# Patient Record
Sex: Female | Born: 1965 | Race: Black or African American | Hispanic: No | Marital: Single | Smoking: Never smoker
Health system: Southern US, Community
[De-identification: ages and names within clinical notes are randomized; demographics above are authoritative.]

---

## 2010-08-20 ENCOUNTER — Encounter: Admission: RE | Admit: 2010-08-20 | Discharge: 2010-08-20 | Payer: Self-pay | Admitting: Geriatric Medicine

## 2010-10-09 ENCOUNTER — Emergency Department (HOSPITAL_COMMUNITY)
Admission: EM | Admit: 2010-10-09 | Discharge: 2010-10-09 | Payer: Self-pay | Source: Home / Self Care | Admitting: Internal Medicine

## 2010-10-14 ENCOUNTER — Emergency Department (HOSPITAL_COMMUNITY)
Admission: EM | Admit: 2010-10-14 | Discharge: 2010-10-14 | Payer: Self-pay | Source: Home / Self Care | Admitting: Emergency Medicine

## 2012-04-27 IMAGING — CR DG CHEST 2V
2 series · 2 of 2 positions shown · non-contrast
Comparison: None.

CLINICAL DATA: Headache.  Cough.  Shortness of breath.

CHEST - 2 VIEW

[w chest pa]
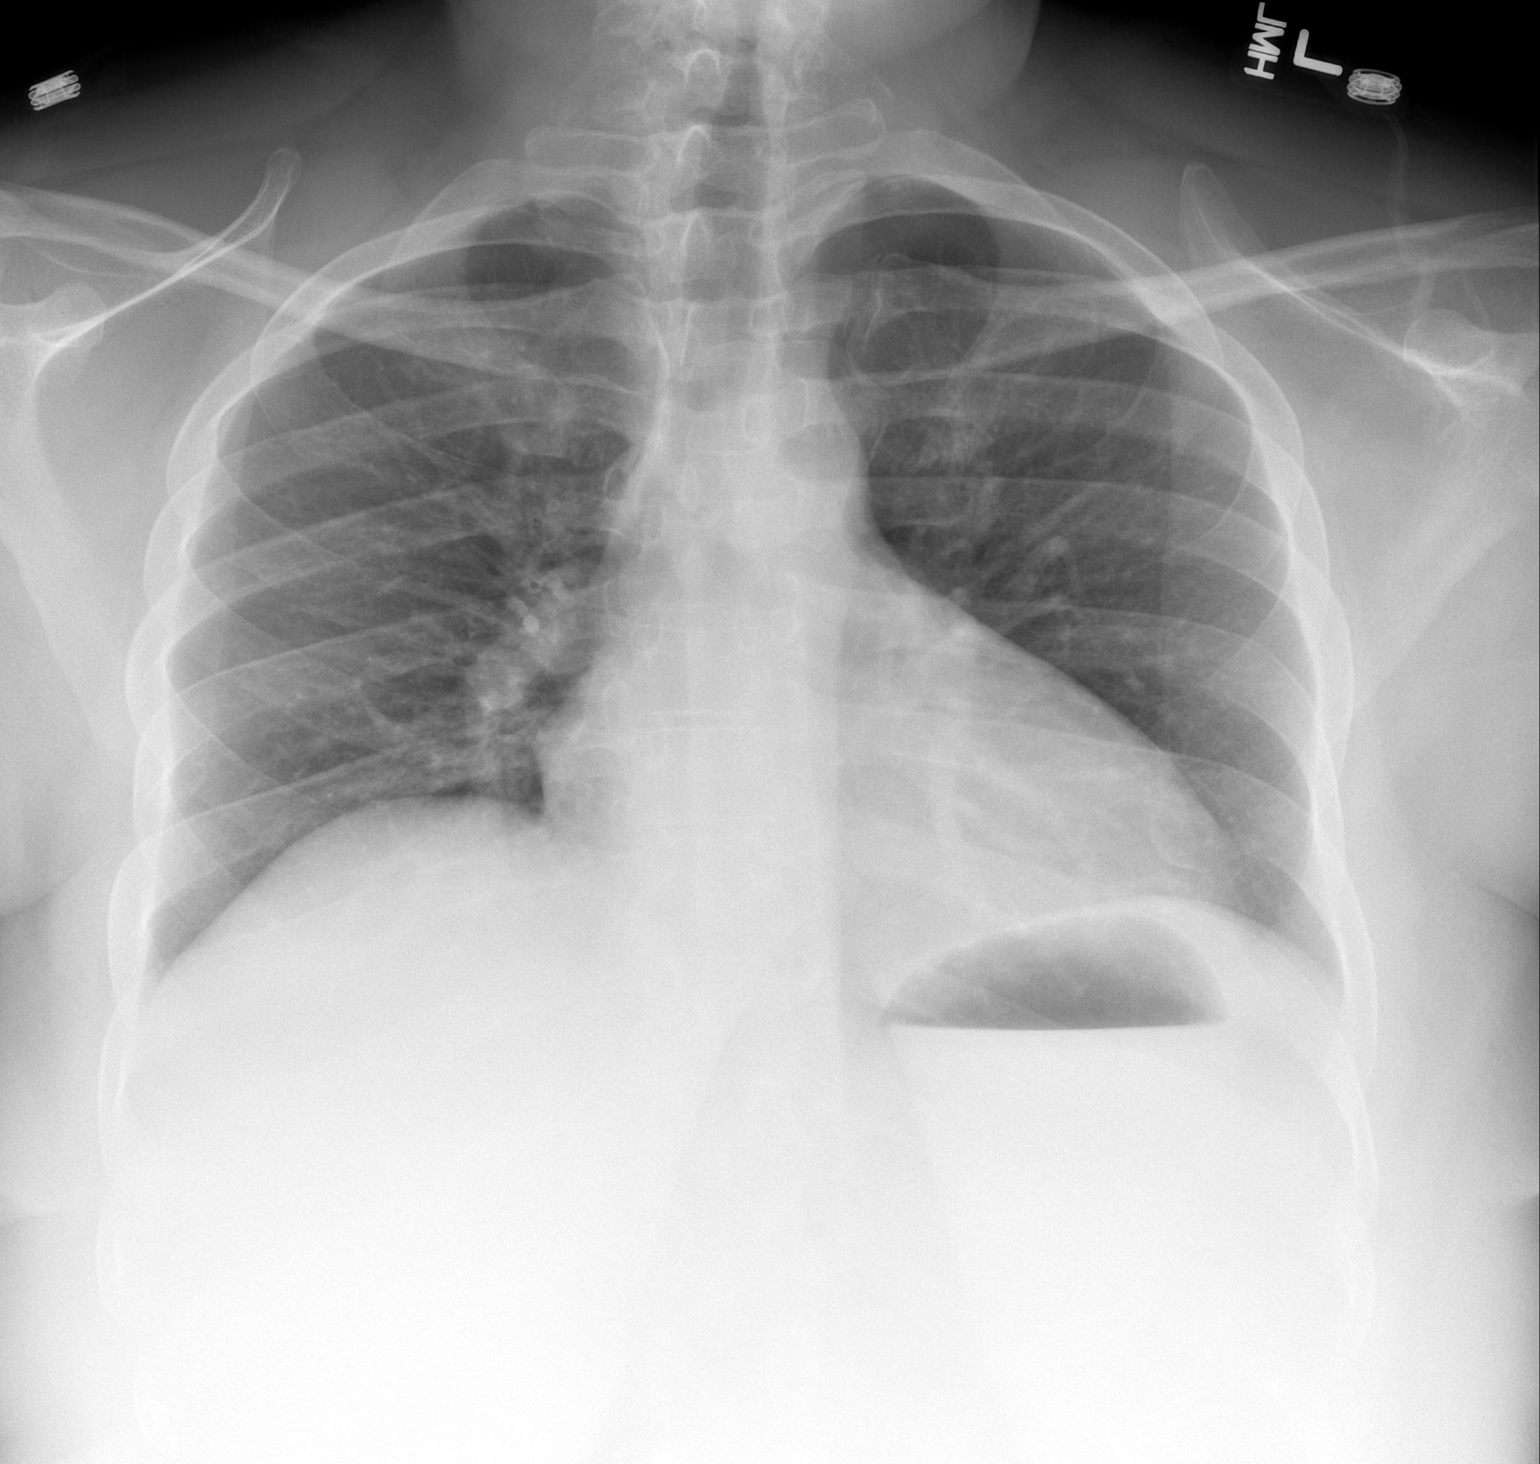

[w chest lat]
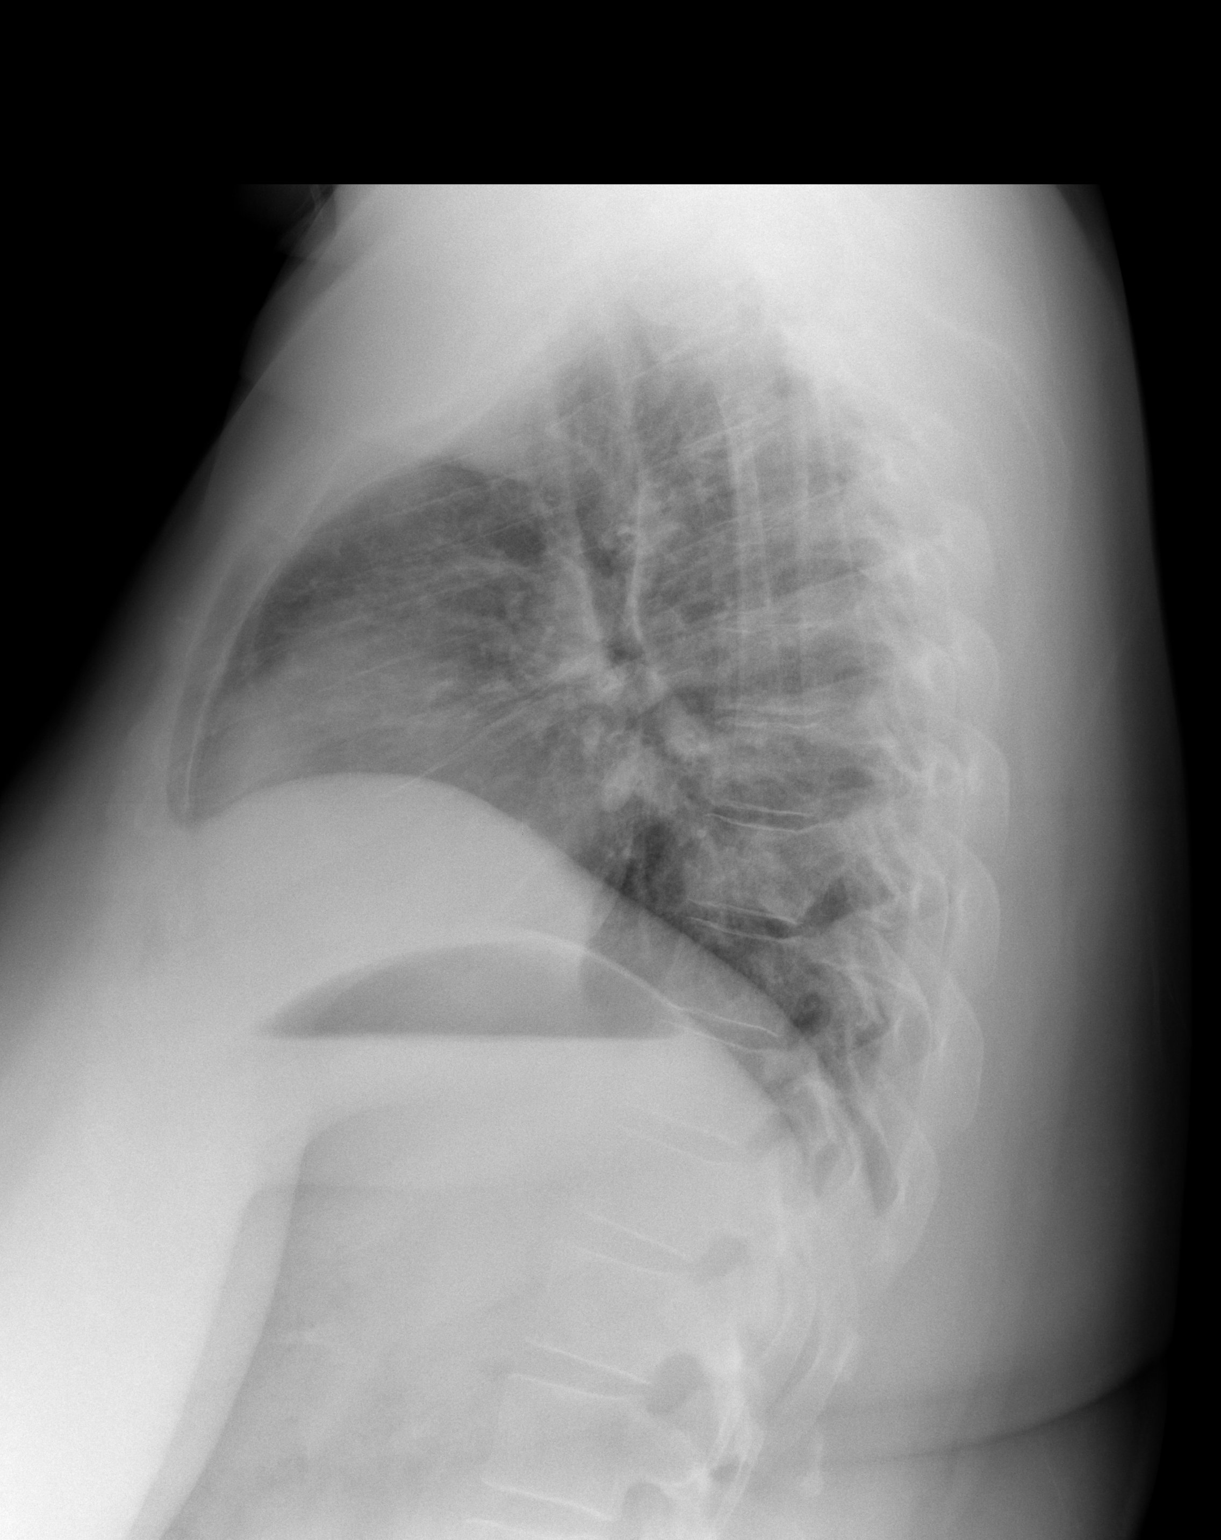

[2 of 2 positions shown; findings below may reference images not displayed]

FINDINGS: The heart size is exaggerated by low lung volumes.
Minimal pulmonary vascular congestion is noted.  No focal airspace
disease is evident.  The visualized soft tissues and bony thorax
are unremarkable.
IMPRESSION: 1.  Minimal pulmonary vascular congestion.
2.  No focal airspace disease.

## 2014-02-07 ENCOUNTER — Encounter (HOSPITAL_COMMUNITY): Payer: Self-pay | Admitting: Emergency Medicine

## 2014-02-07 ENCOUNTER — Emergency Department (HOSPITAL_COMMUNITY)
Admission: EM | Admit: 2014-02-07 | Discharge: 2014-02-07 | Disposition: A | Discharge status: Home / Self Care | Payer: No Typology Code available for payment source | Source: Home / Self Care | Attending: Family Medicine | Admitting: Family Medicine

## 2014-02-07 ENCOUNTER — Emergency Department (INDEPENDENT_AMBULATORY_CARE_PROVIDER_SITE_OTHER): Payer: No Typology Code available for payment source

## 2014-02-07 DIAGNOSIS — J309 Allergic rhinitis, unspecified: Secondary | ICD-10-CM

## 2014-02-07 DIAGNOSIS — J302 Other seasonal allergic rhinitis: Secondary | ICD-10-CM

## 2014-02-07 MED ORDER — FLUTICASONE PROPIONATE 50 MCG/ACT NA SUSP: 1.0000 | Freq: Two times a day (BID) | NASAL | Status: AC

## 2014-02-07 MED ORDER — METHYLPREDNISOLONE ACETATE 40 MG/ML IJ SUSP
80.0000 mg | Freq: Once | INTRAMUSCULAR | Status: AC
  Administered 2014-02-07: 80 mg via INTRAMUSCULAR

## 2014-02-07 MED ORDER — FEXOFENADINE HCL 180 MG PO TABS: 180.0000 mg | ORAL_TABLET | Freq: Every day | ORAL | Status: AC

## 2014-02-07 MED ORDER — METHYLPREDNISOLONE ACETATE 80 MG/ML IJ SUSP
INTRAMUSCULAR | Status: AC
  Filled 2014-02-07: qty 1

## 2014-02-07 NOTE — ED Notes (Signed)
Sore throat, headache, right side pain onset 2 months ago.  Just now received orange card

## 2014-02-07 NOTE — ED Notes (Signed)
Return from xray

## 2014-02-07 NOTE — ED Provider Notes (Signed)
CSN: [id number]     Arrival date & time 02/07/14  [id number] History   First MD Initiated Contact with Patient 02/07/14 1021     Chief Complaint  Patient presents with  . Sore Throat   (Consider location/radiation/quality/duration/timing/severity/associated sxs/prior Treatment) Patient is a 48 y.o. female presenting with URI.  URI Presenting symptoms: congestion, cough, rhinorrhea and sore throat   Presenting symptoms: no fever   Severity:  Mild Onset quality:  Gradual Duration:  2 months Progression:  Unchanged Chronicity:  New Relieved by:  None tried Worsened by:  Nothing tried Risk factors comment:  No smoking.   History reviewed. No pertinent past medical history. History reviewed. No pertinent past surgical history. No family history on file. History  Substance Use Topics  . Smoking status: Never Smoker   . Smokeless tobacco: Not on file  . Alcohol Use: No   OB History   Grav Para Term Preterm Abortions TAB SAB Ect Mult Living                 Review of Systems  Constitutional: Negative.  Negative for fever.  HENT: Positive for congestion, rhinorrhea and sore throat.   Respiratory: Positive for cough.   Cardiovascular: Positive for chest pain.  Gastrointestinal: Negative.     Allergies  Review of patient's allergies indicates no known allergies.  Home Medications   Current Outpatient Rx  Name  Route  Sig  Dispense  Refill  . fexofenadine (ALLEGRA) 180 MG tablet   Oral   Take 1 tablet (180 mg total) by mouth daily.   30 tablet   1   . fluticasone (FLONASE) 50 MCG/ACT nasal spray   Each Nare   Place 1 spray into both nostrils 2 (two) times daily.   1 g   2    BP 128/83  Pulse 100  Temp(Src) 98.2 F (36.8 C) (Oral)  Resp 18  SpO2 97%  LMP 01/29/2014 Physical Exam  Nursing note and vitals reviewed. Constitutional: She is oriented to person, place, and time. She appears well-developed and well-nourished. No distress.  HENT:  Head: Normocephalic.   Right Ear: External ear normal.  Left Ear: External ear normal.  Mouth/Throat: Oropharynx is clear and moist.  Eyes: Conjunctivae and EOM are normal. Pupils are equal, round, and reactive to light.  Neck: Normal range of motion. Neck supple.  Cardiovascular: Normal rate and normal heart sounds.   Pulmonary/Chest: Effort normal and breath sounds normal. She has no wheezes. She has no rales. She exhibits tenderness.  Right lat chest soreness.  Lymphadenopathy:    She has no cervical adenopathy.  Neurological: She is alert and oriented to person, place, and time.  Skin: Skin is warm and dry.    ED Course  Procedures (including critical care time) Labs Review Labs Reviewed - No data to display Imaging Review Dg Chest 2 View  02/07/2014   CLINICAL DATA:  Cough and right-sided chest pain.  EXAM: CHEST  2 VIEW  COMPARISON:  DG CHEST 2 VIEW dated 10/09/2010  FINDINGS: The heart size and mediastinal contours are within normal limits. Both lungs are clear. The visualized skeletal structures are unremarkable.  IMPRESSION: Negative two view chest x-ray   Electronically Signed   By: Gennette Pachris  Mattern M.D.   On: 02/07/2014 11:06    X-rays reviewed and report per radiologist.  MDM   1. Seasonal allergic rhinitis        Linna HoffJames D Myking Sar, MD 02/07/14 1616

## 2014-04-07 ENCOUNTER — Ambulatory Visit: Payer: Self-pay | Admitting: Internal Medicine

## 2015-08-27 IMAGING — CR DG CHEST 2V
3 series · 3 of 3 positions shown · non-contrast
Comparison: DG CHEST 2 VIEW dated 10/09/2010

CLINICAL DATA: Cough and right-sided chest pain.

EXAM:
CHEST  2 VIEW

[view not recorded (1 of 3)]
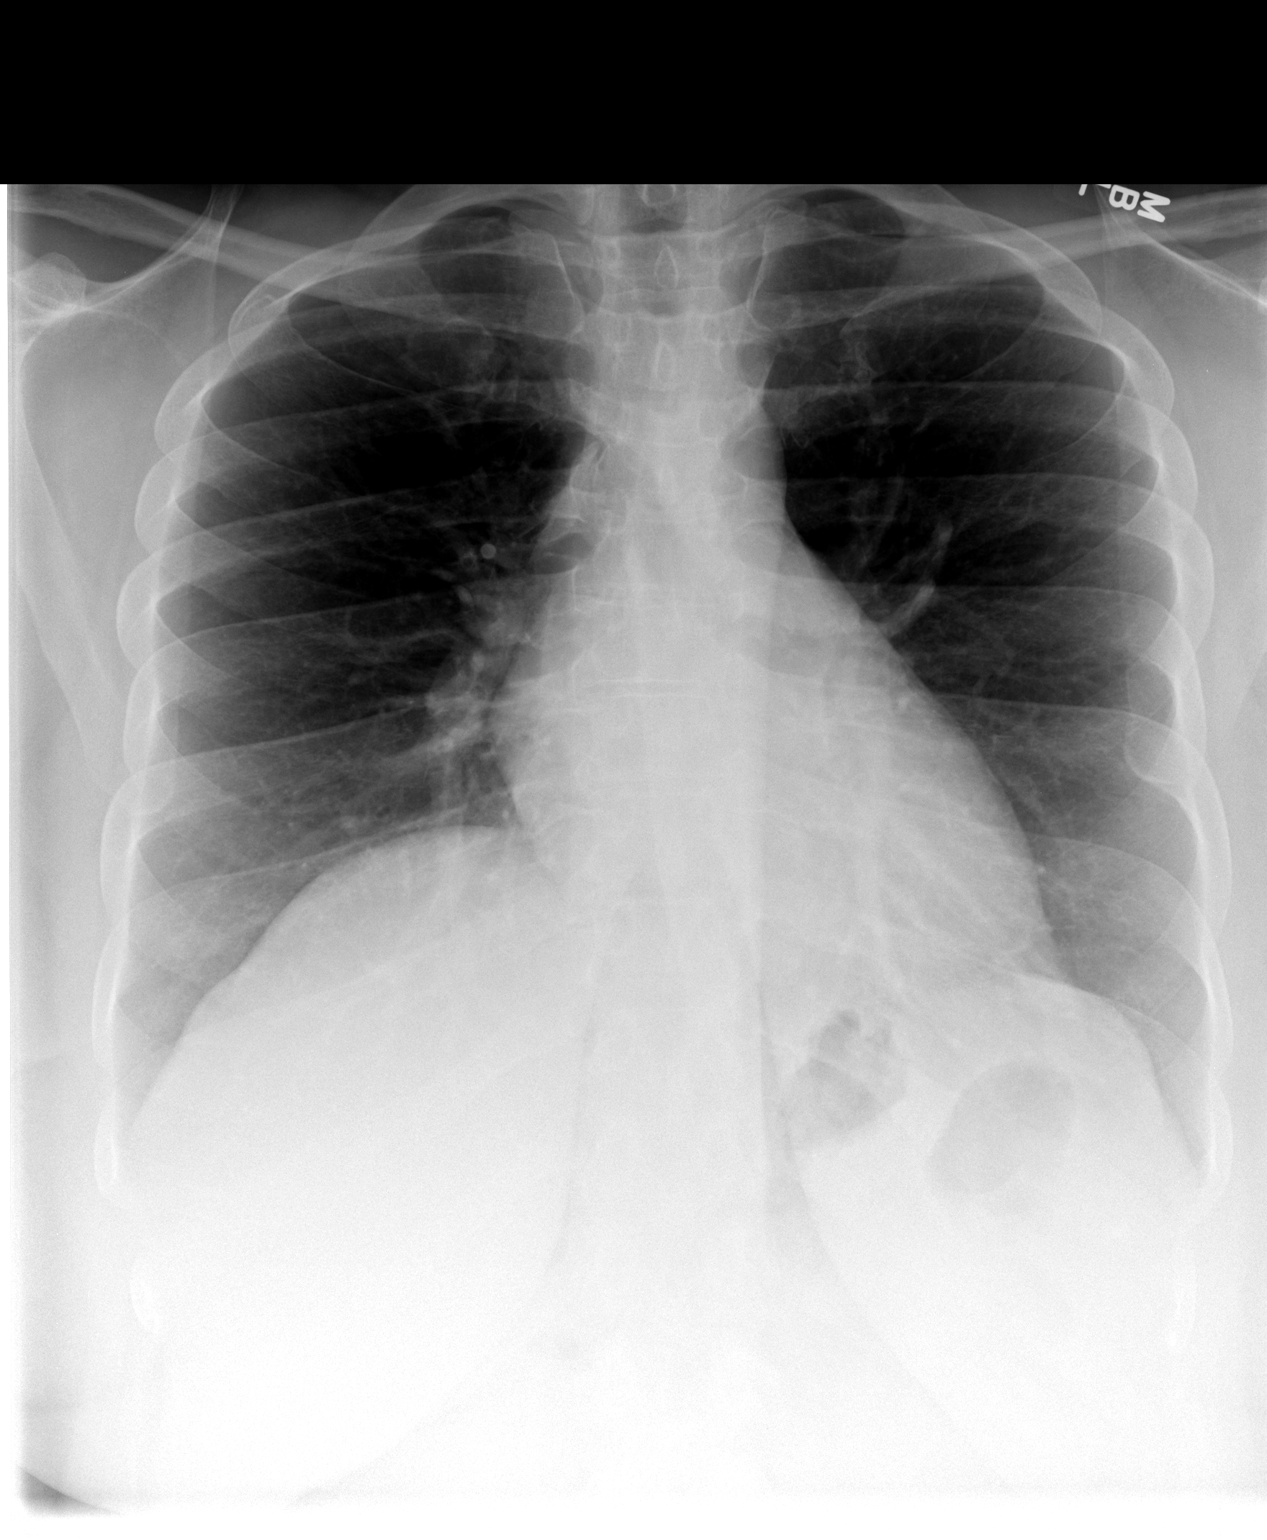

[view not recorded (2 of 3)]
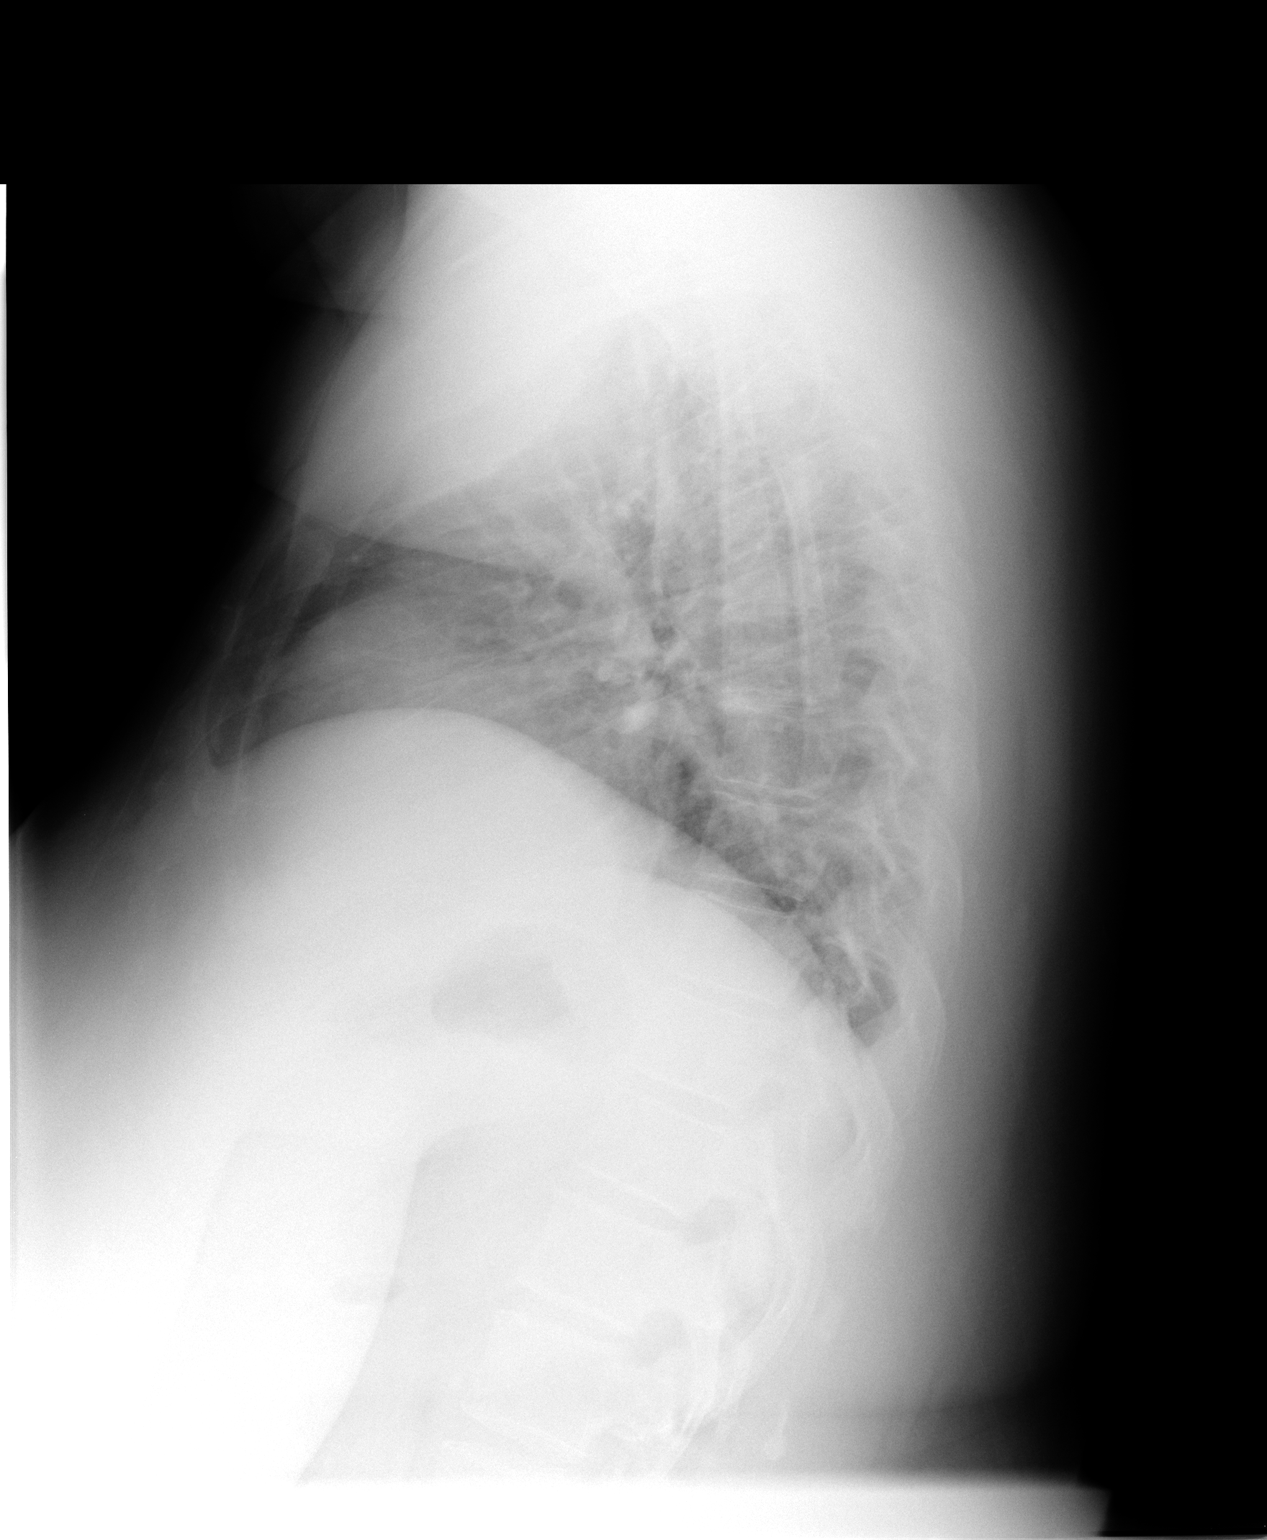

[view not recorded (3 of 3)]
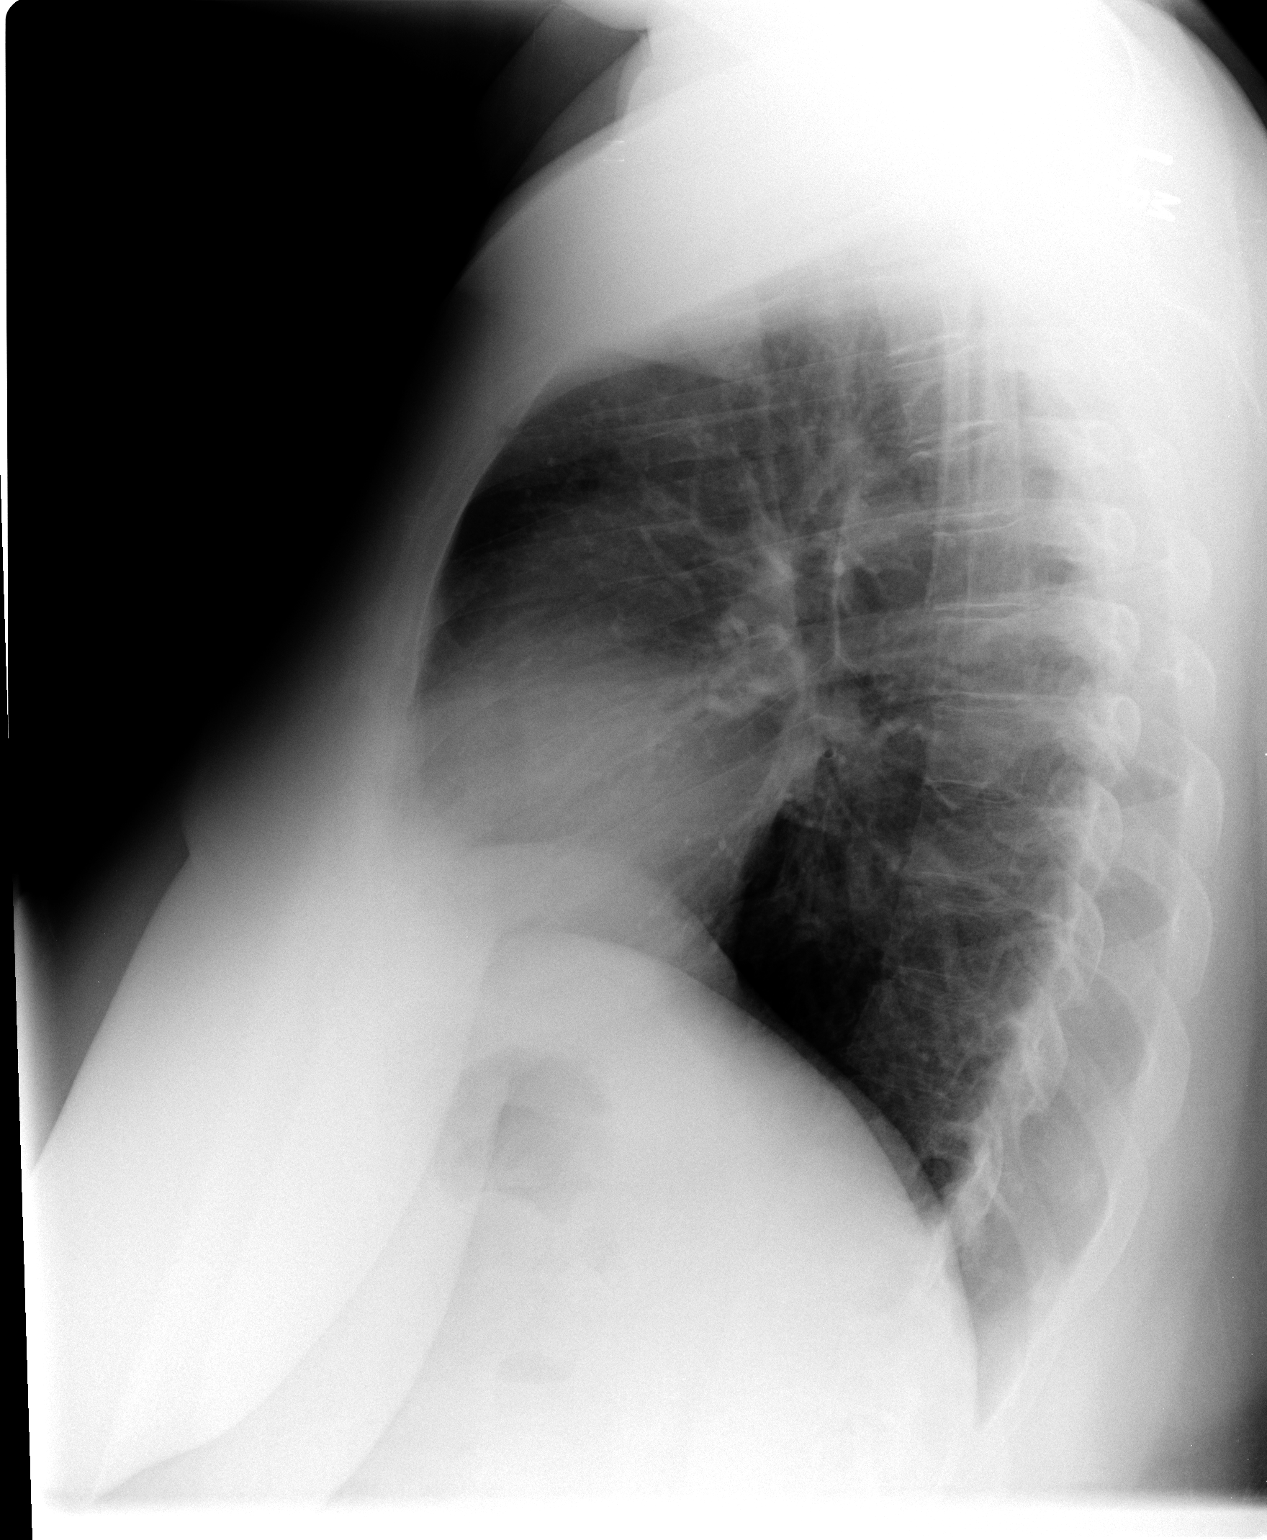

[3 of 3 positions shown; findings below may reference images not displayed]

FINDINGS: The heart size and mediastinal contours are within normal limits.
Both lungs are clear. The visualized skeletal structures are
unremarkable.
IMPRESSION: Negative two view chest x-ray
